# Patient Record
Sex: Female | Born: 1995 | Race: White | Hispanic: No | Marital: Single | Smoking: Never smoker
Health system: Southern US, Community
[De-identification: ages and names within clinical notes are randomized; demographics above are authoritative.]

## PROBLEM LIST (undated history)

## (undated) DIAGNOSIS — R945 Abnormal results of liver function studies: Secondary | ICD-10-CM

## (undated) HISTORY — DX: Abnormal results of liver function studies: R94.5

---
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
## 2016-04-22 ENCOUNTER — Encounter: Payer: Self-pay | Admitting: Obstetrics & Gynecology

## 2016-04-25 ENCOUNTER — Ambulatory Visit (INDEPENDENT_AMBULATORY_CARE_PROVIDER_SITE_OTHER): Payer: BLUE CROSS/BLUE SHIELD | Admitting: Nurse Practitioner

## 2016-04-25 ENCOUNTER — Encounter: Payer: Self-pay | Admitting: Nurse Practitioner

## 2016-04-25 VITALS — BP 118/72 | HR 56 | Resp 16 | Ht 63.75 in | Wt 146.0 lb

## 2016-04-25 DIAGNOSIS — Z01419 Encounter for gynecological examination (general) (routine) without abnormal findings: Secondary | ICD-10-CM

## 2016-04-25 DIAGNOSIS — Z0000 Encounter for general adult medical examination without abnormal findings: Secondary | ICD-10-CM | POA: Diagnosis not present

## 2016-04-25 DIAGNOSIS — Z113 Encounter for screening for infections with a predominantly sexual mode of transmission: Secondary | ICD-10-CM

## 2016-04-25 LAB — POCT URINALYSIS DIPSTICK
BILIRUBIN UA: NEGATIVE
Glucose, UA: NEGATIVE
KETONES UA: NEGATIVE
LEUKOCYTES UA: NEGATIVE
Nitrite, UA: NEGATIVE
Protein, UA: NEGATIVE
Urobilinogen, UA: NEGATIVE
pH, UA: 7

## 2016-04-25 LAB — HEMOGLOBIN, FINGERSTICK: Hemoglobin, fingerstick: 13.6 g/dL (ref 12.0–16.0)

## 2016-04-25 MED ORDER — NORETHIN ACE-ETH ESTRAD-FE 1-20 MG-MCG PO TABS: 1.0000 | ORAL_TABLET | Freq: Every day | ORAL | Status: DC

## 2016-04-25 NOTE — Progress Notes (Signed)
20 y.o. G0P0000 Single  Caucasian Fe here for annual exam.  Menses 5-6 days. Heavy X 2-3 days.  Super tampon changing every 4 hours.  Some cramps and relief with OTC NSAID's.  Same partner for 1 year.  Wants STD's.  No FMH of blood clots, non smoker.  Patient's last menstrual period was 04/21/2016.          Sexually active: Yes.    The current method of family planning is condoms every time.    Exercising: Yes.    run, cycle, swim Smoker:  no  Health Maintenance: Pap:  Never TDaP: Summer 2016 Gardasil: Completed 2015 HIV: will do today Labs: today Urine:  large RBC - on menses   reports that she has never smoked. She has never used smokeless tobacco. She reports that she does not drink alcohol or use illicit drugs.  History reviewed. No pertinent past medical history.  Past Surgical History  Procedure Laterality Date  . Wisdom tooth extraction  2016    Current Outpatient Prescriptions  Medication Sig Dispense Refill  . Ibuprofen (ADVIL) 200 MG CAPS Take by mouth daily as needed.    . norethindrone-ethinyl estradiol (JUNEL FE,GILDESS FE,LOESTRIN FE) 1-20 MG-MCG tablet Take 1 tablet by mouth daily. 3 Package 0   No current facility-administered medications for this visit.    Family History  Problem Relation Age of Onset  . Diabetes Mother   . Heart disease Maternal Grandfather   . Heart disease Paternal Grandfather   . Diabetes Brother     ROS:  Pertinent items are noted in HPI.  Otherwise, a comprehensive ROS was negative.  Exam:   BP 118/72 mmHg  Pulse 56  Resp 16  Ht 5' 3.75" (1.619 m)  Wt 146 lb (66.225 kg)  BMI 25.27 kg/m2  LMP 04/21/2016 Height: 5' 3.75" (161.9 cm) Ht Readings from Last 3 Encounters:  04/25/16 5' 3.75" (1.619 m) (42 %*, Z = -0.20)   * Growth percentiles are based on CDC 2-20 Years data.    General appearance: alert, cooperative and appears stated age Head: Normocephalic, without obvious abnormality, atraumatic Neck: no adenopathy, supple,  symmetrical, trachea midline and thyroid normal to inspection and palpation Lungs: clear to auscultation bilaterally Breasts: normal appearance, no masses or tenderness Heart: regular rate and rhythm Abdomen: soft, non-tender; no masses,  no organomegaly Extremities: extremities normal, atraumatic, no cyanosis or edema Skin: Skin color, texture, turgor normal. No rashes or lesions Lymph nodes: Cervical, supraclavicular, and axillary nodes normal. No abnormal inguinal nodes palpated Neurologic: Grossly normal   Pelvic: External genitalia:  no lesions              Urethra:  normal appearing urethra with no masses, tenderness or lesions              Bartholin's and Skene's: normal                 Vagina: normal appearing vagina with normal color and moderate flow with menses, no lesions              Cervix: anteverted              Pap taken: No. Bimanual Exam:  Uterus:  normal size, contour, position, consistency, mobility, non-tender              Adnexa: no mass, fullness, tenderness               Rectovaginal: Confirms  Anus:  normal sphincter tone, no lesions  Chaperone present: yes  A:  Well Woman with normal exam  Contraception needs  R/O STD's  P:   Reviewed health and wellness pertinent to exam  Pap smear as above  Reviewed all methods of BC including OCP, Nexplanon, IUD, Nuva Ring, Depo Provera  RX Loestrin Fe 1/20 for 3 months then consult to review and BP recheck.  Will follow with STD's  Counseled on breast self exam, STD prevention, HIV risk factors and prevention, use and side effects of OCP's, adequate intake of calcium and vitamin D, diet and exercise return annually or prn  An After Visit Summary was printed and given to the patient.

## 2016-04-25 NOTE — Patient Instructions (Addendum)
General topics  Next pap or exam is  due in 1 year Take a Women's multivitamin Take 1200 mg. of calcium daily - prefer dietary If any concerns in interim to call back  Breast Self-Awareness Practicing breast self-awareness may pick up problems early, prevent significant medical complications, and possibly save your life. By practicing breast self-awareness, you can become familiar with how your breasts look and feel and if your breasts are changing. This allows you to notice changes early. It can also offer you some reassurance that your breast health is good. One way to learn what is normal for your breasts and whether your breasts are changing is to do a breast self-exam. If you find a lump or something that was not present in the past, it is best to contact your caregiver right away. Other findings that should be evaluated by your caregiver include nipple discharge, especially if it is bloody; skin changes or reddening; areas where the skin seems to be pulled in (retracted); or new lumps and bumps. Breast pain is seldom associated with cancer (malignancy), but should also be evaluated by a caregiver. BREAST SELF-EXAM The best time to examine your breasts is 5 7 days after your menstrual period is over.  ExitCare Patient Information 2013 ExitCare[employer] LLC.   Exercise to Stay Healthy Exercise helps you become and stay healthy. EXERCISE IDEAS AND TIPS Choose exercises that:  You enjoy.  Fit into your day. You do not need to exercise really hard to be healthy. You can do exercises at a slow or medium level and stay healthy. You can:  Stretch before and after working out.  Try yoga, Pilates, or tai chi.  Lift weights.  Walk fast, swim, jog, run, climb stairs, bicycle, dance, or rollerskate.  Take aerobic classes. Exercises that burn about 150 calories:  Running 1  miles in 15 minutes.  Playing volleyball for 45 to 60 minutes.  Washing and waxing a car for 45 to 60  minutes.  Playing touch football for 45 minutes.  Walking 1  miles in 35 minutes.  Pushing a stroller 1  miles in 30 minutes.  Playing basketball for 30 minutes.  Raking leaves for 30 minutes.  Bicycling 5 miles in 30 minutes.  Walking 2 miles in 30 minutes.  Dancing for 30 minutes.  Shoveling snow for 15 minutes.  Swimming laps for 20 minutes.  Walking up stairs for 15 minutes.  Bicycling 4 miles in 15 minutes.  Gardening for 30 to 45 minutes.  Jumping rope for 15 minutes.  Washing windows or floors for 45 to 60 minutes. Document Released: 11/12/2010 Document Revised: 01/02/2012 Document Reviewed: 11/12/2010 ExitCare Patient Information 2013 ExitCare[employer] LLC.   Other topics ( that may be useful information):    Sexually Transmitted Disease Sexually transmitted disease (STD) refers to any infection that is passed from person to person during sexual activity. This may happen by way of saliva, semen, blood, vaginal mucus, or urine. Common STDs include:  Gonorrhea.  Chlamydia.  Syphilis.  HIV/AIDS.  Genital herpes.  Hepatitis B and C.  Trichomonas.  Human papillomavirus (HPV).  Pubic lice. CAUSES  An STD may be spread by bacteria, virus, or parasite. A person can get an STD by:  Sexual intercourse with an infected person.  Sharing sex toys with an infected person.  Sharing needles with an infected person.  Having intimate contact with the genitals, mouth, or rectal areas of an infected person. SYMPTOMS  Some people may not have any symptoms, but   they can still pass the infection to others. Different STDs have different symptoms. Symptoms include:  Painful or bloody urination.  Pain in the pelvis, abdomen, vagina, anus, throat, or eyes.  Skin rash, itching, irritation, growths, or sores (lesions). These usually occur in the genital or anal area.  Abnormal vaginal discharge.  Penile discharge in men.  Soft, flesh-colored skin growths in the  genital or anal area.  Fever.  Pain or bleeding during sexual intercourse.  Swollen glands in the groin area.  Yellow skin and eyes (jaundice). This is seen with hepatitis. DIAGNOSIS  To make a diagnosis, your caregiver may:  Take a medical history.  Perform a physical exam.  Take a specimen (culture) to be examined.  Examine a sample of discharge under a microscope.  Perform blood test TREATMENT   Chlamydia, gonorrhea, trichomonas, and syphilis can be cured with antibiotic medicine.  Genital herpes, hepatitis, and HIV can be treated, but not cured, with prescribed medicines. The medicines will lessen the symptoms.  Genital warts from HPV can be treated with medicine or by freezing, burning (electrocautery), or surgery. Warts may come back.  HPV is a virus and cannot be cured with medicine or surgery.However, abnormal areas may be followed very closely by your caregiver and may be removed from the cervix, vagina, or vulva through office procedures or surgery. If your diagnosis is confirmed, your recent sexual partners need treatment. This is true even if they are symptom-free or have a negative culture or evaluation. They should not have sex until their caregiver says it is okay. HOME CARE INSTRUCTIONS  All sexual partners should be informed, tested, and treated for all STDs.  Take your antibiotics as directed. Finish them even if you start to feel better.  Only take over-the-counter or prescription medicines for pain, discomfort, or fever as directed by your caregiver.  Rest.  Eat a balanced diet and drink enough fluids to keep your urine clear or pale yellow.  Do not have sex until treatment is completed and you have followed up with your caregiver. STDs should be checked after treatment.  Keep all follow-up appointments, Pap tests, and blood tests as directed by your caregiver.  Only use latex condoms and water-soluble lubricants during sexual activity. Do not use  petroleum jelly or oils.  Avoid alcohol and illegal drugs.  Get vaccinated for HPV and hepatitis. If you have not received these vaccines in the past, talk to your caregiver about whether one or both might be right for you.  Avoid risky sex practices that can break the skin. The only way to avoid getting an STD is to avoid all sexual activity.Latex condoms and dental dams (for oral sex) will help lessen the risk of getting an STD, but will not completely eliminate the risk. SEEK MEDICAL CARE IF:   You have a fever.  You have any new or worsening symptoms. Document Released: 12/31/2002 Document Revised: 01/02/2012 Document Reviewed: 01/07/2011 [facility] Patient Information 2013 [employer].    Domestic Abuse You are being battered or abused if someone close to you hits, pushes, or physically hurts you in any way. You also are being abused if you are forced into activities. You are being sexually abused if you are forced to have sexual contact of any kind. You are being emotionally abused if you are made to feel worthless or if you are constantly threatened. It is important to remember that help is available. No one has the right to abuse you. PREVENTION OF FURTHER  ABUSE  Learn the warning signs of danger. This varies with situations but may include: the use of alcohol, threats, isolation from friends and family, or forced sexual contact. Leave if you feel that violence is going to occur.  If you are attacked or beaten, report it to the police so the abuse is documented. You do not have to press charges. The police can protect you while you or the attackers are leaving. Get the officer's name and badge number and a copy of the report.  Find someone you can trust and tell them what is happening to you: your caregiver, a nurse, clergy member, close friend or family member. Feeling ashamed is natural, but remember that you have done nothing wrong. No one deserves abuse. Document Released:  10/07/2000 Document Revised: 01/02/2012 Document Reviewed: 12/16/2010 ExitCare Patient Information 2013 ExitCare, LLC.    How Much is Too Much Alcohol? Drinking too much alcohol can cause injury, accidents, and health problems. These types of problems can include:   Car crashes.  Falls.  Family fighting (domestic violence).  Drowning.  Fights.  Injuries.  Burns.  Damage to certain organs.  Having a baby with birth defects. ONE DRINK CAN BE TOO MUCH WHEN YOU ARE:  Working.  Pregnant or breastfeeding.  Taking medicines. Ask your doctor.  Driving or planning to drive. If you or someone you know has a drinking problem, get help from a doctor.  Document Released: 08/06/2009 Document Revised: 01/02/2012 Document Reviewed: 08/06/2009 ExitCare Patient Information 2013 ExitCare[employer].   Smoking Hazards Smoking cigarettes is extremely bad for your health. Tobacco smoke has over 200 known poisons in it. There are over 60 chemicals in tobacco smoke that cause cancer. Some of the chemicals found in cigarette smoke include:   Cyanide.  Benzene.  Formaldehyde.  Methanol (wood alcohol).  Acetylene (fuel used in welding torches).  Ammonia. Cigarette smoke also contains the poisonous gases nitrogen oxide and carbon monoxide.  Cigarette smokers have an increased risk of many serious medical problems and Smoking causes approximately:  90% of all lung cancer deaths in men.  80% of all lung cancer deaths in women.  90% of deaths from chronic obstructive lung disease. Compared with nonsmokers, smoking increases the risk of:  Coronary heart disease by 2 to 4 times.  Stroke by 2 to 4 times.  Men developing lung cancer by 23 times.  Women developing lung cancer by 13 times.  Dying from chronic obstructive lung diseases by 12 times.  . Smoking is the most preventable cause of death and disease in our society.  WHY IS SMOKING ADDICTIVE?  Nicotine is the chemical  agent in tobacco that is capable of causing addiction or dependence.  When you smoke and inhale, nicotine is absorbed rapidly into the bloodstream through your lungs. Nicotine absorbed through the lungs is capable of creating a powerful addiction. Both inhaled and non-inhaled nicotine may be addictive.  Addiction studies of cigarettes and spit tobacco show that addiction to nicotine occurs mainly during the teen years, when young people begin using tobacco products. WHAT ARE THE BENEFITS OF QUITTING?  There are many health benefits to quitting smoking.   Likelihood of developing cancer and heart disease decreases. Health improvements are seen almost immediately.  Blood pressure, pulse rate, and breathing patterns start returning to normal soon after quitting. QUITTING SMOKING   American Lung Association - 1-800-LUNGUSA  American Cancer Society - 1-800-ACS-2345 Document Released: 11/17/2004 Document Revised: 01/02/2012 Document Reviewed: 07/22/2009 ExitCare Patient Information 2013 ExitCare,   LLC.   Stress Management Stress is a state of physical or mental tension that often results from changes in your life or normal routine. Some common causes of stress are:  Death of a loved one.  Injuries or severe illnesses.  Getting fired or changing jobs.  Moving into a new home. Other causes may be:  Sexual problems.  Business or financial losses.  Taking on a large debt.  Regular conflict with someone at home or at work.  Constant tiredness from lack of sleep. It is not just bad things that are stressful. It may be stressful to:  Win the lottery.  Get married.  Buy a new car. The amount of stress that can be easily tolerated varies from person to person. Changes generally cause stress, regardless of the types of change. Too much stress can affect your health. It may lead to physical or emotional problems. Too little stress (boredom) may also become stressful. SUGGESTIONS TO  REDUCE STRESS:  Talk things over with your family and friends. It often is helpful to share your concerns and worries. If you feel your problem is serious, you may want to get help from a professional counselor.  Consider your problems one at a time instead of lumping them all together. Trying to take care of everything at once may seem impossible. List all the things you need to do and then start with the most important one. Set a goal to accomplish 2 or 3 things each day. If you expect to do too many in a single day you will naturally fail, causing you to feel even more stressed.  Do not use alcohol or drugs to relieve stress. Although you may feel better for a short time, they do not remove the problems that caused the stress. They can also be habit forming.  Exercise regularly - at least 3 times per week. Physical exercise can help to relieve that "uptight" feeling and will relax you.  The shortest distance between despair and hope is often a good night's sleep.  Go to bed and get up on time allowing yourself time for appointments without being rushed.  Take a short "time-out" period from any stressful situation that occurs during the day. Close your eyes and take some deep breaths. Starting with the muscles in your face, tense them, hold it for a few seconds, then relax. Repeat this with the muscles in your neck, shoulders, hand, stomach, back and legs.  Take good care of yourself. Eat a balanced diet and get plenty of rest.  Schedule time for having fun. Take a break from your daily routine to relax. HOME CARE INSTRUCTIONS   Call if you feel overwhelmed by your problems and feel you can no longer manage them on your own.  Return immediately if you feel like hurting yourself or someone else. Document Released: 04/05/2001 Document Revised: 01/02/2012 Document Reviewed: 11/26/2007 ExitCare Patient Information 2013 ExitCare, LLC.  Oral Contraception Information Oral contraceptive  pills (OCPs) are medicines taken to prevent pregnancy. OCPs work by preventing the ovaries from releasing eggs. The hormones in OCPs also cause the cervical mucus to thicken, preventing the sperm from entering the uterus. The hormones also cause the uterine lining to become thin, not allowing a fertilized egg to attach to the inside of the uterus. OCPs are highly effective when taken exactly as prescribed. However, OCPs do not prevent sexually transmitted diseases (STDs). Safe sex practices, such as using condoms along with the pill, can help prevent STDs.    Before taking the pill, you may have a physical exam and Pap test. Your health care provider may order blood tests. The health care provider will make sure you are a good candidate for oral contraception. Discuss with your health care provider the possible side effects of the OCP you may be prescribed. When starting an OCP, it can take 2 to 3 months for the body to adjust to the changes in hormone levels in your body.  TYPES OF ORAL CONTRACEPTION  The combination pill--This pill contains estrogen and progestin (synthetic progesterone) hormones. The combination pill comes in 21-day, 28-day, or 91-day packs. Some types of combination pills are meant to be taken continuously (365-day pills). With 21-day packs, you do not take pills for 7 days after the last pill. With 28-day packs, the pill is taken every day. The last 7 pills are without hormones. Certain types of pills have more than 21 hormone-containing pills. With 91-day packs, the first 84 pills contain both hormones, and the last 7 pills contain no hormones or contain estrogen only.  The minipill--This pill contains the progesterone hormone only. The pill is taken every day continuously. It is very important to take the pill at the same time each day. The minipill comes in packs of 28 pills. All 28 pills contain the hormone.  ADVANTAGES OF ORAL CONTRACEPTIVE PILLS  Decreases premenstrual symptoms.    Treats menstrual period cramps.   Regulates the menstrual cycle.   Decreases a heavy menstrual flow.   May treatacne, depending on the type of pill.   Treats abnormal uterine bleeding.   Treats polycystic ovarian syndrome.   Treats endometriosis.   Can be used as emergency contraception.  THINGS THAT CAN MAKE ORAL CONTRACEPTIVE PILLS LESS EFFECTIVE OCPs can be less effective if:   You forget to take the pill at the same time every day.   You have a stomach or intestinal disease that lessens the absorption of the pill.   You take OCPs with other medicines that make OCPs less effective, such as antibiotics, certain HIV medicines, and some seizure medicines.   You take expired OCPs.   You forget to restart the pill on day 7, when using the packs of 21 pills.  RISKS ASSOCIATED WITH ORAL CONTRACEPTIVE PILLS  Oral contraceptive pills can sometimes cause side effects, such as:  Headache.  Nausea.  Breast tenderness.  Irregular bleeding or spotting. Combination pills are also associated with a small increased risk of:  Blood clots.  Heart attack.  Stroke.   This information is not intended to replace advice given to you by your health care provider. Make sure you discuss any questions you have with your health care provider.   Document Released: 12/31/2002 Document Revised: 07/31/2013 Document Reviewed: 03/31/2013 Elsevier Interactive Patient Education 2016 Elsevier Inc.  

## 2016-04-26 LAB — STD PANEL
HIV 1&2 Ab, 4th Generation: NONREACTIVE
Hepatitis B Surface Ag: NEGATIVE

## 2016-04-26 NOTE — Progress Notes (Signed)
Encounter reviewed by Dr. Parminder Cupples Amundson C. Silva.  

## 2016-04-28 LAB — IPS N GONORRHOEA AND CHLAMYDIA BY PCR

## 2016-05-12 ENCOUNTER — Other Ambulatory Visit: Payer: Self-pay | Admitting: *Deleted

## 2016-05-12 NOTE — Telephone Encounter (Signed)
Patient called. States she is supposed to have an OCP follow up in September but she is going to be in school. Should she come earlier or after during thanksgiving holiday?   [facility]  Medication refill request: OCP Last AEX:  04/25/16 PG Next AEX: None Last MMG (if hormonal medication request): None Refill authorized: 04/25/16 #[id number]. Today please advise.

## 2016-05-13 NOTE — Telephone Encounter (Signed)
Will not refill she has 3 already

## 2016-05-13 NOTE — Telephone Encounter (Signed)
She needs appointment right before school starts. Please schedule

## 2016-05-16 ENCOUNTER — Telehealth: Payer: Self-pay | Admitting: Certified Nurse Midwife

## 2016-05-16 MED ORDER — NORETHIN ACE-ETH ESTRAD-FE 1-20 MG-MCG PO TABS: 1.0000 | ORAL_TABLET | Freq: Every day | ORAL | 0 refills | Status: DC

## 2016-05-16 NOTE — Telephone Encounter (Signed)
Called patient. Rescheduled appt for 10/06. Per Mrs Alexia Freestone.

## 2016-05-16 NOTE — Addendum Note (Signed)
Addended by: Loreta Ave on: 05/16/2016 12:42 PM   Modules accepted: Orders

## 2016-05-16 NOTE — Telephone Encounter (Signed)
Patient scheduled her follow up appointment 05/31/16 with Leota Sauers, CNM.

## 2016-05-16 NOTE — Telephone Encounter (Signed)
Needs refill sent to pharmacy to last until 08/2016

## 2016-05-16 NOTE — Telephone Encounter (Signed)
Left voicemail to call back.  Mrs Heidi Ruiz states she can be seen during the holidays. She needs to schedule OV

## 2016-05-31 ENCOUNTER — Ambulatory Visit: Payer: BLUE CROSS/BLUE SHIELD | Admitting: Certified Nurse Midwife

## 2016-07-11 ENCOUNTER — Other Ambulatory Visit: Payer: Self-pay | Admitting: Nurse Practitioner

## 2016-07-11 NOTE — Telephone Encounter (Signed)
Medication refill request: OCP Last AEX:  04-25-16 Next AEX: not scheduled  Next OV: 07-29-16 Last MMG (if hormonal medication request): N/A Refill authorized: please advise

## 2016-07-11 NOTE — Telephone Encounter (Signed)
She has an apt on 10/6 for recheck - ok to refill X 1 time.

## 2016-07-29 ENCOUNTER — Encounter: Payer: Self-pay | Admitting: Nurse Practitioner

## 2016-07-29 ENCOUNTER — Ambulatory Visit (INDEPENDENT_AMBULATORY_CARE_PROVIDER_SITE_OTHER): Payer: BLUE CROSS/BLUE SHIELD | Admitting: Nurse Practitioner

## 2016-07-29 VITALS — BP 122/84 | HR 64 | Ht 63.75 in | Wt 140.0 lb

## 2016-07-29 DIAGNOSIS — Z8669 Personal history of other diseases of the nervous system and sense organs: Secondary | ICD-10-CM

## 2016-07-29 DIAGNOSIS — Z3041 Encounter for surveillance of contraceptive pills: Secondary | ICD-10-CM | POA: Diagnosis not present

## 2016-07-29 MED ORDER — NORETHINDRONE ACET-ETHINYL EST 1.5-30 MG-MCG PO TABS: 1.0000 | ORAL_TABLET | Freq: Every day | ORAL | 3 refills | Status: DC

## 2016-07-29 NOTE — Progress Notes (Signed)
20 y.o. Single Caucasian female G0P0000 here for consult visit to follow up of OCP.  She has a history of dysmenorrhea and menorrhagia.  She also needed something for birth control.  She started on Loestrin Fe 1/20 in July.  This is her 3 month recheck visit.  First pack of OCP with BTB at midcycle.  Menses was a little lighter and shorter.  Second month with some BTB at mid cycle again but cycle was shorter and lighter.  Total cycle now at 4 days.  At 3rd pack still BTB that was heavier and longer X 4 day.  Now on 4 th pack.  There has not been any compliance issues. I have discussed with the great importance of following the treatment plan exactly as directed in order to achieve a good medical outcome.   She is having less PMS.  First 2 pack some nausea at first part of pills but gets better after a few days - none 3 & 4 th pack.  Same partner but he is away for studies abroad.  She also has a sore place - bump in her left ear that is bothersome.  It did have some discharge initially.  Has been cleaning wit hydrogen peroxide.  Wants to get this checked today.  Denies any other medical problems or pelvic symptoms.    O: Healthy WD,WN female Affect: normal Skin: left outer ear canal with no exudate but there is swelling from trying to get the bump out.  No induration, not warm to touch, no lymphadenopathy   A: Follow up on OCP  Sebaceous cyst left ear canal    P:  Discussed that we will increase her OCP to stop the BTB at mid cycle - if any problems or continued BTB to call back.  GC & Chl was negative 04/25/16.  Cyst left ear canal   Labs:  none   Instructions given regarding:  Use warm compresses prn - use OTC antibiotic ointment BID - if not better by Monday to call.  She may need a short course of Keflex.  RV

## 2016-07-29 NOTE — Patient Instructions (Signed)

## 2016-08-02 NOTE — Progress Notes (Signed)
Encounter reviewed by Dr. Brook Amundson C. Silva.  

## 2016-08-12 ENCOUNTER — Other Ambulatory Visit: Payer: Self-pay | Admitting: Nurse Practitioner

## 2016-08-12 NOTE — Telephone Encounter (Signed)
Medication refill request: MICROGESTIN FE Last AEX:  04/25/16 PG Next AEX: none scheduled Last MMG (if hormonal medication request): n/a Refill authorized: 07/29/16 #3packs w/3 refills; today refuse; already refilled

## 2016-10-05 DIAGNOSIS — J029 Acute pharyngitis, unspecified: Secondary | ICD-10-CM | POA: Diagnosis not present

## 2016-10-05 DIAGNOSIS — B2790 Infectious mononucleosis, unspecified without complication: Secondary | ICD-10-CM | POA: Diagnosis not present

## 2016-11-20 DIAGNOSIS — Z9289 Personal history of other medical treatment: Secondary | ICD-10-CM | POA: Diagnosis not present

## 2016-11-20 DIAGNOSIS — J00 Acute nasopharyngitis [common cold]: Secondary | ICD-10-CM | POA: Diagnosis not present

## 2016-11-20 DIAGNOSIS — J029 Acute pharyngitis, unspecified: Secondary | ICD-10-CM | POA: Diagnosis not present

## 2016-12-16 DIAGNOSIS — R300 Dysuria: Secondary | ICD-10-CM | POA: Diagnosis not present

## 2016-12-24 DIAGNOSIS — N3001 Acute cystitis with hematuria: Secondary | ICD-10-CM | POA: Diagnosis not present

## 2017-02-02 DIAGNOSIS — Z202 Contact with and (suspected) exposure to infections with a predominantly sexual mode of transmission: Secondary | ICD-10-CM | POA: Diagnosis not present

## 2017-02-21 DIAGNOSIS — Z113 Encounter for screening for infections with a predominantly sexual mode of transmission: Secondary | ICD-10-CM | POA: Diagnosis not present

## 2017-02-21 DIAGNOSIS — Z114 Encounter for screening for human immunodeficiency virus [HIV]: Secondary | ICD-10-CM | POA: Diagnosis not present

## 2017-04-21 ENCOUNTER — Telehealth: Payer: Self-pay | Admitting: Nurse Practitioner

## 2017-04-21 NOTE — Telephone Encounter (Signed)
Left message regarding upcoming appointment has been canceled and needs to be rescheduled. °

## 2017-05-02 ENCOUNTER — Ambulatory Visit: Payer: BLUE CROSS/BLUE SHIELD | Admitting: Nurse Practitioner

## 2017-05-02 NOTE — Progress Notes (Signed)
Patient ID: Heidi Ruiz, female   DOB: 1995/11/04, 21 y.o.   MRN: [id number]  21 y.o. [id number] SingleCaucasianF here for annual exam.   On OCP's. Cycles q month x 5 days, saturates a super + tampon in about 6 hours. No BTB. Cramps are tolerable, helped with Advil. Sexually active, same partner x 1.5 years. Using condoms. No pain.     Patient's last menstrual period was 04/17/2017.          Sexually active: Yes.    The current method of family planning is OCP (estrogen/progesterone).    Exercising: Yes.    running, swimming Smoker:  no  Health Maintenance: Pap: None History of abnormal Pap:  no TDaP:  04/2015 Gardasil: Completed 2015   reports that she has never smoked. She has never used smokeless tobacco. She reports that she does not drink alcohol or use drugs. Occasional ETOH. She goes to [employer], [employer].   History reviewed. No pertinent past medical history.  Past Surgical History:  Procedure Laterality Date  . WISDOM TOOTH EXTRACTION  2016    Current Outpatient Prescriptions  Medication Sig Dispense Refill  . Ibuprofen (ADVIL) 200 MG CAPS Take by mouth daily as needed.    . Norethindrone Acetate-Ethinyl Estradiol (LOESTRIN 1.5/30, 21,) 1.5-30 MG-MCG tablet Take 1 tablet by mouth daily. 3 Package 3   No current facility-administered medications for this visit.     Family History  Problem Relation Age of Onset  . Diabetes Mother   . Heart disease Maternal Grandfather   . Heart disease Paternal Grandfather   . Diabetes Brother     Review of Systems  Constitutional: Negative.   HENT: Negative.   Eyes: Negative.   Respiratory: Negative.   Cardiovascular: Negative.   Gastrointestinal: Negative.   Endocrine: Negative.   Genitourinary: Negative.   Musculoskeletal: Negative.   Skin: Negative.   Allergic/Immunologic: Negative.   Neurological: Negative.   Hematological: Negative.   Psychiatric/Behavioral: Negative.     Exam:   BP 120/82 (BP  Location: Right Arm, Patient Position: Sitting, Cuff Size: Normal)   Pulse 66   Resp 14   Ht 5\' 4"  (1.626 m)   Wt 142 lb (64.4 kg)   LMP 04/17/2017   BMI 24.37 kg/m   Weight change: @WEIGHTCHANGE @ Height:   Height: 5\' 4"  (162.6 cm)  Ht Readings from Last 3 Encounters:  05/05/17 5\' 4"  (1.626 m) (45 %, Z= -0.12)*  07/29/16 5' 3.75" (1.619 m) (42 %, Z= -0.21)*  04/25/16 5' 3.75" (1.619 m) (42 %, Z= -0.20)*   * Growth percentiles are based on CDC 2-20 Years data.    General appearance: alert, cooperative and appears stated age Head: Normocephalic, without obvious abnormality, atraumatic Neck: no adenopathy, supple, symmetrical, trachea midline and thyroid thyroid asymmetric R>L concerning for possible nodule Lungs: clear to auscultation bilaterally Cardiovascular: regular rate and rhythm Breasts: normal appearance, no masses or tenderness Abdomen: soft, non-tender; bowel sounds normal; no masses,  no organomegaly Extremities: extremities normal, atraumatic, no cyanosis or edema Skin: Skin color, texture, turgor normal. No rashes or lesions Lymph nodes: Cervical, supraclavicular, and axillary nodes normal. No abnormal inguinal nodes palpated Neurologic: Grossly normal Pelvic: deferred   A:  Well Woman exam  Right lobe of thyroid enlarged compared to the left  Screening STD  Family history of diabetes  Contraception, doing well on OCP's  Dysmenorrhea, mild, tolerable with advil    P:   No pap until she is 22  STD testing  Screening labs  Continue OCP's  Condoms encouraged  Thyroid ultrasound, TFT's  Discussed calcium and vit D intake  Information on breast self awareness given

## 2017-05-05 ENCOUNTER — Ambulatory Visit (INDEPENDENT_AMBULATORY_CARE_PROVIDER_SITE_OTHER): Payer: BLUE CROSS/BLUE SHIELD | Admitting: Obstetrics and Gynecology

## 2017-05-05 ENCOUNTER — Encounter: Payer: Self-pay | Admitting: Obstetrics and Gynecology

## 2017-05-05 VITALS — BP 120/82 | HR 66 | Resp 14 | Ht 64.0 in | Wt 142.0 lb

## 2017-05-05 DIAGNOSIS — Z833 Family history of diabetes mellitus: Secondary | ICD-10-CM

## 2017-05-05 DIAGNOSIS — Z01411 Encounter for gynecological examination (general) (routine) with abnormal findings: Secondary | ICD-10-CM | POA: Diagnosis not present

## 2017-05-05 DIAGNOSIS — E049 Nontoxic goiter, unspecified: Secondary | ICD-10-CM | POA: Diagnosis not present

## 2017-05-05 DIAGNOSIS — Z113 Encounter for screening for infections with a predominantly sexual mode of transmission: Secondary | ICD-10-CM | POA: Diagnosis not present

## 2017-05-05 DIAGNOSIS — Z3041 Encounter for surveillance of contraceptive pills: Secondary | ICD-10-CM

## 2017-05-05 DIAGNOSIS — Z0000 Encounter for general adult medical examination without abnormal findings: Secondary | ICD-10-CM

## 2017-05-05 MED ORDER — NORETHINDRONE ACET-ETHINYL EST 1.5-30 MG-MCG PO TABS: 1.0000 | ORAL_TABLET | Freq: Every day | ORAL | 3 refills | Status: DC

## 2017-05-05 NOTE — Patient Instructions (Signed)
EXERCISE AND DIET:  We recommended that you start or continue a regular exercise program for good health. Regular exercise means any activity that makes your heart beat faster and makes you sweat.  We recommend exercising at least 30 minutes per day at least 3 days a week, preferably 4 or 5.  We also recommend a diet low in fat and sugar.  Inactivity, poor dietary choices and obesity can cause diabetes, heart attack, stroke, and kidney damage, among others.    ALCOHOL AND SMOKING:  Women should limit their alcohol intake to no more than 7 drinks/beers/glasses of wine (combined, not each!) per week. Moderation of alcohol intake to this level decreases your risk of breast cancer and liver damage. And of course, no recreational drugs are part of a healthy lifestyle.  And absolutely no smoking or even second hand smoke. Most people know smoking can cause heart and lung diseases, but did you know it also contributes to weakening of your bones? Aging of your skin?  Yellowing of your teeth and nails?  CALCIUM AND VITAMIN D:  Adequate intake of calcium and Vitamin D are recommended.  The recommendations for exact amounts of these supplements seem to change often, but generally speaking 600 mg of calcium (either carbonate or citrate) and 800 units of Vitamin D per day seems prudent. Certain women may benefit from higher intake of Vitamin D.  If you are among these women, your doctor will have told you during your visit.    PAP SMEARS:  Pap smears, to check for cervical cancer or precancers,  have traditionally been done yearly, although recent scientific advances have shown that most women can have pap smears less often.  However, every woman still should have a physical exam from her gynecologist every year. It will include a breast check, inspection of the vulva and vagina to check for abnormal growths or skin changes, a visual exam of the cervix, and then an exam to evaluate the size and shape of the uterus and  ovaries.  And after 20 years of age, a rectal exam is indicated to check for rectal cancers. We will also provide age appropriate advice regarding health maintenance, like when you should have certain vaccines, screening for sexually transmitted diseases, bone density testing, colonoscopy, mammograms, etc.    Breast Self-Awareness Breast self-awareness means being familiar with how your breasts look and feel. It involves checking your breasts regularly and reporting any changes to your health care provider. Practicing breast self-awareness is important. A change in your breasts can be a sign of a serious medical problem. Being familiar with how your breasts look and feel allows you to find any problems early, when treatment is more likely to be successful. All women should practice breast self-awareness, including women who have had breast implants. How to do a breast self-exam One way to learn what is normal for your breasts and whether your breasts are changing is to do a breast self-exam. To do a breast self-exam: Look for Changes  1. Remove all the clothing above your waist. 2. Stand in front of a mirror in a room with good lighting. 3. Put your hands on your hips. 4. Push your hands firmly downward. 5. Compare your breasts in the mirror. Look for differences between them (asymmetry), such as: ? Differences in shape. ? Differences in size. ? Puckers, dips, and bumps in one breast and not the other. 6. Look at each breast for changes in your skin, such as: ? Redness. ?   Scaly areas. 7. Look for changes in your nipples, such as: ? Discharge. ? Bleeding. ? Dimpling. ? Redness. ? A change in position. Feel for Changes  Carefully feel your breasts for lumps and changes. It is best to do this while lying on your back on the floor and again while sitting or standing in the shower or tub with soapy water on your skin. Feel each breast in the following way:  Place the arm on the side of the  breast you are examining above your head.  Feel your breast with the other hand.  Start in the nipple area and make  inch (2 cm) overlapping circles to feel your breast. Use the pads of your three middle fingers to do this. Apply light pressure, then medium pressure, then firm pressure. The light pressure will allow you to feel the tissue closest to the skin. The medium pressure will allow you to feel the tissue that is a little deeper. The firm pressure will allow you to feel the tissue close to the ribs.  Continue the overlapping circles, moving downward over the breast until you feel your ribs below your breast.  Move one finger-width toward the center of the body. Continue to use the  inch (2 cm) overlapping circles to feel your breast as you move slowly up toward your collarbone.  Continue the up and down exam using all three pressures until you reach your armpit.  Write Down What You Find  Write down what is normal for each breast and any changes that you find. Keep a written record with breast changes or normal findings for each breast. By writing this information down, you do not need to depend only on memory for size, tenderness, or location. Write down where you are in your menstrual cycle, if you are still menstruating. If you are having trouble noticing differences in your breasts, do not get discouraged. With time you will become more familiar with the variations in your breasts and more comfortable with the exam. How often should I examine my breasts? Examine your breasts every month. If you are breastfeeding, the best time to examine your breasts is after a feeding or after using a breast pump. If you menstruate, the best time to examine your breasts is 5-7 days after your period is over. During your period, your breasts are lumpier, and it may be more difficult to notice changes. When should I see my health care provider? See your health care provider if you notice:  A change  in shape or size of your breasts or nipples.  A change in the skin of your breast or nipples, such as a reddened or scaly area.  Unusual discharge from your nipples.  A lump or thick area that was not there before.  Pain in your breasts.  Anything that concerns you.  This information is not intended to replace advice given to you by your health care provider. Make sure you discuss any questions you have with your health care provider. Document Released: 10/10/2005 Document Revised: 03/17/2016 Document Reviewed: 08/30/2015 Elsevier Interactive Patient Education  2018 Elsevier Inc.   

## 2017-05-06 LAB — CBC
HEMATOCRIT: 40.3 % (ref 34.0–46.6)
HEMOGLOBIN: 13.2 g/dL (ref 11.1–15.9)
MCH: 30.2 pg (ref 26.6–33.0)
MCHC: 32.8 g/dL (ref 31.5–35.7)
MCV: 92 fL (ref 79–97)
Platelets: 252 10*3/uL (ref 150–379)
RBC: 4.37 x10E6/uL (ref 3.77–5.28)
RDW: 12.7 % (ref 12.3–15.4)
WBC: 4.5 10*3/uL (ref 3.4–10.8)

## 2017-05-06 LAB — HEMOGLOBIN A1C
ESTIMATED AVERAGE GLUCOSE: 100 mg/dL
HEMOGLOBIN A1C: 5.1 % (ref 4.8–5.6)

## 2017-05-06 LAB — COMPREHENSIVE METABOLIC PANEL
ALBUMIN: 4.3 g/dL (ref 3.5–5.5)
ALT: 17 IU/L (ref 0–32)
AST: 17 IU/L (ref 0–40)
Albumin/Globulin Ratio: 1.5 (ref 1.2–2.2)
Alkaline Phosphatase: 51 IU/L (ref 39–117)
BUN / CREAT RATIO: 18 (ref 9–23)
BUN: 12 mg/dL (ref 6–20)
Bilirubin Total: 0.4 mg/dL (ref 0.0–1.2)
CALCIUM: 9.4 mg/dL (ref 8.7–10.2)
CO2: 25 mmol/L (ref 20–29)
CREATININE: 0.68 mg/dL (ref 0.57–1.00)
Chloride: 102 mmol/L (ref 96–106)
GFR calc Af Amer: 147 mL/min/{1.73_m2} (ref >59)
GFR, EST NON AFRICAN AMERICAN: 127 mL/min/{1.73_m2} (ref >59)
GLOBULIN, TOTAL: 2.8 g/dL (ref 1.5–4.5)
GLUCOSE: 80 mg/dL (ref 65–99)
Potassium: 4.1 mmol/L (ref 3.5–5.2)
Sodium: 139 mmol/L (ref 134–144)
Total Protein: 7.1 g/dL (ref 6.0–8.5)

## 2017-05-06 LAB — LIPID PANEL
Chol/HDL Ratio: 2.9 ratio (ref 0.0–4.4)
Cholesterol, Total: 138 mg/dL (ref 100–169)
HDL: 47 mg/dL (ref >39)
LDL Calculated: 75 mg/dL (ref 0–109)
TRIGLYCERIDES: 80 mg/dL (ref 0–89)
VLDL CHOLESTEROL CAL: 16 mg/dL (ref 5–40)

## 2017-05-06 LAB — HEP, RPR, HIV PANEL
HIV SCREEN 4TH GENERATION: NONREACTIVE
Hepatitis B Surface Ag: NEGATIVE
RPR: NONREACTIVE

## 2017-05-06 LAB — THYROID PANEL WITH TSH
FREE THYROXINE INDEX: 2.2 (ref 1.2–4.9)
T3 UPTAKE RATIO: 22 % — AB (ref 24–39)
T4, Total: 10 ug/dL (ref 4.5–12.0)
TSH: 1.76 u[IU]/mL (ref 0.450–4.500)

## 2017-05-06 LAB — HEPATITIS C ANTIBODY: Hep C Virus Ab: <0.1 s/co ratio (ref 0.0–0.9)

## 2017-05-09 LAB — GC/CHLAMYDIA PROBE AMP
Chlamydia trachomatis, NAA: NEGATIVE
Neisseria gonorrhoeae by PCR: NEGATIVE

## 2017-05-16 ENCOUNTER — Ambulatory Visit
Admission: RE | Admit: 2017-05-16 | Discharge: 2017-05-16 | Disposition: A | Discharge status: Home / Self Care | Payer: BLUE CROSS/BLUE SHIELD | Source: Ambulatory Visit | Attending: Obstetrics and Gynecology | Admitting: Obstetrics and Gynecology

## 2017-05-16 DIAGNOSIS — E049 Nontoxic goiter, unspecified: Secondary | ICD-10-CM

## 2017-05-18 ENCOUNTER — Telehealth: Payer: Self-pay

## 2017-05-18 NOTE — Telephone Encounter (Signed)
Spoke with patient. Advised of message as seen below from Dr.Jertson. Patient verbalizes understanding. Patient is driving to work and will return call to schedule 3 month follow up.

## 2017-05-18 NOTE — Telephone Encounter (Signed)
-----   Message from Romualdo BolkJill Evelyn Jertson, MD sent at 05/17/2017  7:37 PM EDT ----- Please inform the patient that her thyroid is mildly enlarged, but no distinct nodules. Exam suggestive of underlying thyroiditis. One of her TFT's was slightly off. Please have her return in 3 months for a thyroid panel.

## 2017-05-24 ENCOUNTER — Telehealth: Payer: Self-pay

## 2017-05-24 NOTE — Telephone Encounter (Signed)
Patient has made 3 month lab appointment 08-21-17 to recheck thyroid.

## 2017-05-24 NOTE — Telephone Encounter (Signed)
Erroneous encounter

## 2017-05-24 NOTE — Progress Notes (Signed)
Patient has lab appointment 08-21-17.

## 2017-05-24 NOTE — Telephone Encounter (Signed)
Patient has f/u lab appointment 08-21-17.

## 2017-08-21 ENCOUNTER — Other Ambulatory Visit (INDEPENDENT_AMBULATORY_CARE_PROVIDER_SITE_OTHER): Payer: BLUE CROSS/BLUE SHIELD

## 2017-08-21 ENCOUNTER — Other Ambulatory Visit: Payer: Self-pay

## 2017-08-21 DIAGNOSIS — R7989 Other specified abnormal findings of blood chemistry: Secondary | ICD-10-CM

## 2017-08-22 LAB — THYROID PANEL WITH TSH
FREE THYROXINE INDEX: 3.3 (ref 1.2–4.9)
T3 UPTAKE RATIO: 29 % (ref 24–39)
T4, Total: 11.4 ug/dL (ref 4.5–12.0)
TSH: 2.71 u[IU]/mL (ref 0.450–4.500)

## 2017-10-24 DIAGNOSIS — R7989 Other specified abnormal findings of blood chemistry: Secondary | ICD-10-CM

## 2017-10-24 HISTORY — DX: Other specified abnormal findings of blood chemistry: R79.89

## 2018-05-10 ENCOUNTER — Ambulatory Visit: Payer: BLUE CROSS/BLUE SHIELD | Admitting: Obstetrics and Gynecology

## 2018-06-11 ENCOUNTER — Other Ambulatory Visit (HOSPITAL_COMMUNITY)
Admission: RE | Admit: 2018-06-11 | Discharge: 2018-06-11 | Disposition: A | Discharge status: Home / Self Care | Payer: BLUE CROSS/BLUE SHIELD | Source: Ambulatory Visit | Attending: Obstetrics and Gynecology | Admitting: Obstetrics and Gynecology

## 2018-06-11 ENCOUNTER — Other Ambulatory Visit: Payer: Self-pay

## 2018-06-11 ENCOUNTER — Ambulatory Visit (INDEPENDENT_AMBULATORY_CARE_PROVIDER_SITE_OTHER): Payer: BLUE CROSS/BLUE SHIELD | Admitting: Obstetrics and Gynecology

## 2018-06-11 ENCOUNTER — Encounter: Payer: Self-pay | Admitting: Obstetrics and Gynecology

## 2018-06-11 VITALS — BP 98/62 | HR 64 | Resp 14 | Ht 64.0 in | Wt 138.0 lb

## 2018-06-11 DIAGNOSIS — R748 Abnormal levels of other serum enzymes: Secondary | ICD-10-CM | POA: Diagnosis not present

## 2018-06-11 DIAGNOSIS — Z113 Encounter for screening for infections with a predominantly sexual mode of transmission: Secondary | ICD-10-CM | POA: Insufficient documentation

## 2018-06-11 DIAGNOSIS — Z01419 Encounter for gynecological examination (general) (routine) without abnormal findings: Secondary | ICD-10-CM

## 2018-06-11 MED ORDER — NORETHINDRONE ACET-ETHINYL EST 1.5-30 MG-MCG PO TABS: 1.0000 | ORAL_TABLET | Freq: Every day | ORAL | 3 refills | Status: DC

## 2018-06-11 NOTE — Patient Instructions (Signed)
EXERCISE AND DIET:  We recommended that you start or continue a regular exercise program for good health. Regular exercise means any activity that makes your heart beat faster and makes you sweat.  We recommend exercising at least 30 minutes per day at least 3 days a week, preferably 4 or 5.  We also recommend a diet low in fat and sugar.  Inactivity, poor dietary choices and obesity can cause diabetes, heart attack, stroke, and kidney damage, among others.    ALCOHOL AND SMOKING:  Women should limit their alcohol intake to no more than 7 drinks/beers/glasses of wine (combined, not each!) per week. Moderation of alcohol intake to this level decreases your risk of breast cancer and liver damage. And of course, no recreational drugs are part of a healthy lifestyle.  And absolutely no smoking or even second hand smoke. Most people know smoking can cause heart and lung diseases, but did you know it also contributes to weakening of your bones? Aging of your skin?  Yellowing of your teeth and nails?  CALCIUM AND VITAMIN D:  Adequate intake of calcium and Vitamin D are recommended.  The recommendations for exact amounts of these supplements seem to change often, but generally speaking 600 mg of calcium (either carbonate or citrate) and 800 units of Vitamin D per day seems prudent. Certain women may benefit from higher intake of Vitamin D.  If you are among these women, your doctor will have told you during your visit.    PAP SMEARS:  Pap smears, to check for cervical cancer or precancers,  have traditionally been done yearly, although recent scientific advances have shown that most women can have pap smears less often.  However, every woman still should have a physical exam from her gynecologist every year. It will include a breast check, inspection of the vulva and vagina to check for abnormal growths or skin changes, a visual exam of the cervix, and then an exam to evaluate the size and shape of the uterus and  ovaries.  And after 21 years of age, a rectal exam is indicated to check for rectal cancers. We will also provide age appropriate advice regarding health maintenance, like when you should have certain vaccines, screening for sexually transmitted diseases, bone density testing, colonoscopy, mammograms, etc.   MAMMOGRAMS:  All women over 40 years old should have a yearly mammogram. Many facilities now offer a "3D" mammogram, which may cost around $50 extra out of pocket. If possible,  we recommend you accept the option to have the 3D mammogram performed.  It both reduces the number of women who will be called back for extra views which then turn out to be normal, and it is better than the routine mammogram at detecting truly abnormal areas.    COLONOSCOPY:  Colonoscopy to screen for colon cancer is recommended for all women at age 50.  We know, you hate the idea of the prep.  We agree, BUT, having colon cancer and not knowing it is worse!!  Colon cancer so often starts as a polyp that can be seen and removed at colonscopy, which can quite literally save your life!  And if your first colonoscopy is normal and you have no family history of colon cancer, most women don't have to have it again for 10 years.  Once every ten years, you can do something that may end up saving your life, right?  We will be happy to help you get it scheduled when you are ready.    Be sure to check your insurance coverage so you understand how much it will cost.  It may be covered as a preventative service at no cost, but you should check your particular policy.     Ethinyl Estradiol; Etonogestrel vaginal ring What is this medicine? ETHINYL ESTRADIOL; ETONOGESTREL (ETH in il es tra DYE ole; et oh noe JES trel) vaginal ring is a flexible, vaginal ring used as a contraceptive (birth control method). This medicine combines two types of female hormones, an estrogen and a progestin. This ring is used to prevent ovulation and pregnancy. Each  ring is effective for one month. This medicine may be used for other purposes; ask your health care provider or pharmacist if you have questions. COMMON BRAND NAME(S): NuvaRing What should I tell my health care provider before I take this medicine? They need to know if you have or ever had any of these conditions: -abnormal vaginal bleeding -blood vessel disease or blood clots -breast, cervical, endometrial, ovarian, liver, or uterine cancer -diabetes -gallbladder disease -heart disease or recent heart attack -high blood pressure -high cholesterol -kidney disease -liver disease -migraine headaches -stroke -systemic lupus erythematosus (SLE) -tobacco smoker -an unusual or allergic reaction to estrogens, progestins, other medicines, foods, dyes, or preservatives -pregnant or trying to get pregnant -breast-feeding How should I use this medicine? Insert the ring into your vagina as directed. Follow the directions on the prescription label. The ring will remain place for 3 weeks and is then removed for a 1-week break. A new ring is inserted 1 week after the last ring was removed, on the same day of the week. Check often to make sure the ring is still in place, especially before and after sexual intercourse. If the ring was out of the vagina for an unknown amount of time, you may not be protected from pregnancy. Perform a pregnancy test and call your doctor. Do not use more often than directed. A patient package insert for the product will be given with each prescription and refill. Read this sheet carefully each time. The sheet may change frequently. Contact your pediatrician regarding the use of this medicine in children. Special care may be needed. This medicine has been used in female children who have started having menstrual periods. Overdosage: If you think you have taken too much of this medicine contact a poison control center or emergency room at once. NOTE: This medicine is only for  you. Do not share this medicine with others. What if I miss a dose? You will need to replace your vaginal ring once a month as directed. If the ring should slip out, or if you leave it in longer or shorter than you should, contact your health care professional for advice. What may interact with this medicine? Do not take this medicine with the following medication: -dasabuvir; ombitasvir; paritaprevir; ritonavir -ombitasvir; paritaprevir; ritonavir This medicine may also interact with the following medications: -acetaminophen -antibiotics or medicines for infections, especially rifampin, rifabutin, rifapentine, and griseofulvin, and possibly penicillins or tetracyclines -aprepitant -ascorbic acid (vitamin C) -atorvastatin -barbiturate medicines, such as phenobarbital -bosentan -carbamazepine -caffeine -clofibrate -cyclosporine -dantrolene -doxercalciferol -felbamate -grapefruit juice -hydrocortisone -medicines for anxiety or sleeping problems, such as diazepam or temazepam -medicines for diabetes, including pioglitazone -modafinil -mycophenolate -nefazodone -oxcarbazepine -phenytoin -prednisolone -ritonavir or other medicines for HIV infection or AIDS -rosuvastatin -selegiline -soy isoflavones supplements -St. John's wort -tamoxifen or raloxifene -theophylline -thyroid hormones -topiramate -warfarin This list may not describe all possible interactions. Give your health care provider a list   of all the medicines, herbs, non-prescription drugs, or dietary supplements you use. Also tell them if you smoke, drink alcohol, or use illegal drugs. Some items may interact with your medicine. What should I watch for while using this medicine? Visit your doctor or health care professional for regular checks on your progress. You will need a regular breast and pelvic exam and Pap smear while on this medicine. Use an additional method of contraception during the first cycle that you use  this ring. Do not use a diaphragm or female condom, as the ring can interfere with these birth control methods and their proper placement. If you have any reason to think you are pregnant, stop using this medicine right away and contact your doctor or health care professional. If you are using this medicine for hormone related problems, it may take several cycles of use to see improvement in your condition. Smoking increases the risk of getting a blood clot or having a stroke while you are using hormonal birth control, especially if you are more than 21 years old. You are strongly advised not to smoke. This medicine can make your body retain fluid, making your fingers, hands, or ankles swell. Your blood pressure can go up. Contact your doctor or health care professional if you feel you are retaining fluid. This medicine can make you more sensitive to the sun. Keep out of the sun. If you cannot avoid being in the sun, wear protective clothing and use sunscreen. Do not use sun lamps or tanning beds/booths. If you wear contact lenses and notice visual changes, or if the lenses begin to feel uncomfortable, consult your eye care specialist. In some women, tenderness, swelling, or minor bleeding of the gums may occur. Notify your dentist if this happens. Brushing and flossing your teeth regularly may help limit this. See your dentist regularly and inform your dentist of the medicines you are taking. If you are going to have elective surgery, you may need to stop using this medicine before the surgery. Consult your health care professional for advice. This medicine does not protect you against HIV infection (AIDS) or any other sexually transmitted diseases. What side effects may I notice from receiving this medicine? Side effects that you should report to your doctor or health care professional as soon as possible: -breast tissue changes or discharge -changes in vaginal bleeding during your period or between  your periods -chest pain -coughing up blood -dizziness or fainting spells -headaches or migraines -leg, arm or groin pain -severe or sudden headaches -stomach pain (severe) -sudden shortness of breath -sudden loss of coordination, especially on one side of the body -speech problems -symptoms of vaginal infection like itching, irritation or unusual discharge -tenderness in the upper abdomen -vomiting -weakness or numbness in the arms or legs, especially on one side of the body -yellowing of the eyes or skin Side effects that usually do not require medical attention (report to your doctor or health care professional if they continue or are bothersome): -breakthrough bleeding and spotting that continues beyond the 3 initial cycles of pills -breast tenderness -mood changes, anxiety, depression, frustration, anger, or emotional outbursts -increased sensitivity to sun or ultraviolet light -nausea -skin rash, acne, or brown spots on the skin -weight gain (slight) This list may not describe all possible side effects. Call your doctor for medical advice about side effects. You may report side effects to FDA at 1-800-FDA-1088. Where should I keep my medicine? Keep out of the reach of children. Store at   room temperature between 15 and 30 degrees C (59 and 86 degrees F) for up to 4 months. The product will expire after 4 months. Protect from light. Throw away any unused medicine after the expiration date. NOTE: This sheet is a summary. It may not cover all possible information. If you have questions about this medicine, talk to your doctor, pharmacist, or health care provider.  2018 Elsevier/Gold Standard (2016-06-17 17:00:31)  

## 2018-06-11 NOTE — Progress Notes (Signed)
22 y.o. 680P0000 Single Caucasian female here for annual exam.    Happy with her OCPs overall.  Takes Ibuprofen for cramping. Missed one pill per month.   Thyroid last year showing thyroiditis.  TFTs normal in October 2019.   Feels cold.   Primary school [employer]ic [employer] at [employer].  PCP: No PCP    Patient's last menstrual period was 06/10/2018.           Sexually active: Yes.    The current method of family planning is LOESTRIN.  Condoms.  Exercising: Yes.    running, body weights, and light weight training Smoker:  no  Health Maintenance: Pap:  n/a History of abnormal Pap:  n/a TDaP:  04/2015 Gardasil:   Completed 2015 HIV and Hep C: 05/05/17 Negative Screening Labs: discuss today   reports that she has never smoked. She has never used smokeless tobacco. She reports that she does not drink alcohol or use drugs.  History reviewed. No pertinent past medical history.  Past Surgical History:  Procedure Laterality Date  . WISDOM TOOTH EXTRACTION  2016    Current Outpatient Medications  Medication Sig Dispense Refill  . Ibuprofen (ADVIL) 200 MG CAPS Take by mouth daily as needed.    . Norethindrone Acetate-Ethinyl Estradiol (LOESTRIN 1.5/30, 21,) 1.5-30 MG-MCG tablet Take 1 tablet by mouth daily. 3 Package 3   No current facility-administered medications for this visit.     Family History  Problem Relation Age of Onset  . Diabetes Mother   . Heart disease Maternal Grandfather   . Heart disease Paternal Grandfather   . Diabetes Brother     Review of Systems  All other systems reviewed and are negative.   Exam:   BP 98/62 (BP Location: Right Arm, Patient Position: Sitting, Cuff Size: Normal)   Pulse 64   Resp 14   Ht 5\' 4"  (1.626 m)   Wt 138 lb (62.6 kg)   LMP 06/10/2018   BMI 23.69 kg/m     General appearance: alert, cooperative and appears stated age Head: Normocephalic, without obvious abnormality, atraumatic Neck: no adenopathy, supple, symmetrical, trachea midline  and thyroid normal to inspection and palpation Lungs: clear to auscultation bilaterally Breasts: normal appearance, no masses or tenderness, No nipple retraction or dimpling, No nipple discharge or bleeding, No axillary or supraclavicular adenopathy Heart: regular rate and rhythm Abdomen: soft, non-tender; no masses, no organomegaly Extremities: extremities normal, atraumatic, no cyanosis or edema Skin: Skin color, texture, turgor normal. No rashes or lesions Lymph nodes: Cervical, supraclavicular, and axillary nodes normal. No abnormal inguinal nodes palpated Neurologic: Grossly normal  Pelvic: External genitalia:  no lesions              Urethra:  normal appearing urethra with no masses, tenderness or lesions              Bartholins and Skenes: normal                 Vagina: normal appearing vagina with normal color and discharge, no lesions              Cervix: no lesions              Pap taken: No. Bimanual Exam:  Uterus:  normal size, contour, position, consistency, mobility, non-tender              Adnexa: no mass, fullness, tenderness      Chaperone was present for exam.  Assessment:   Well woman visit with normal  exam. Hx thyroid enlargement.  US shoed thyroiditis.   Plan: Mammogram screening age 22. Recommended self breast awareness. Pap and HR HPV as above. Guidelines for Calcium, Vitamin D, regular exercise program including cardiovascular and weight bearing exercise. STD screening.  Routine labs and TSH.  Refill OCPs for one year.   I also discussed alternatives to OCPs - Ring, patch, IUDs, Depo Provera. Follow up annually and prn.   After visit summary provided.

## 2018-06-12 LAB — CBC
Hematocrit: 40.1 % (ref 34.0–46.6)
Hemoglobin: 13.1 g/dL (ref 11.1–15.9)
MCH: 28.9 pg (ref 26.6–33.0)
MCHC: 32.7 g/dL (ref 31.5–35.7)
MCV: 89 fL (ref 79–97)
Platelets: 263 10*3/uL (ref 150–450)
RBC: 4.53 x10E6/uL (ref 3.77–5.28)
RDW: 11.7 % — AB (ref 12.3–15.4)
WBC: 5.1 10*3/uL (ref 3.4–10.8)

## 2018-06-12 LAB — LIPID PANEL
CHOL/HDL RATIO: 2.7 ratio (ref 0.0–4.4)
Cholesterol, Total: 133 mg/dL (ref 100–199)
HDL: 50 mg/dL (ref >39)
LDL CALC: 69 mg/dL (ref 0–99)
Triglycerides: 69 mg/dL (ref 0–149)
VLDL Cholesterol Cal: 14 mg/dL (ref 5–40)

## 2018-06-12 LAB — COMPREHENSIVE METABOLIC PANEL
A/G RATIO: 1.8 (ref 1.2–2.2)
ALT: 35 IU/L — AB (ref 0–32)
AST: 71 IU/L — AB (ref 0–40)
Albumin: 4.6 g/dL (ref 3.5–5.5)
Alkaline Phosphatase: 47 IU/L (ref 39–117)
BUN/Creatinine Ratio: 18 (ref 9–23)
BUN: 12 mg/dL (ref 6–20)
Bilirubin Total: 0.5 mg/dL (ref 0.0–1.2)
CALCIUM: 9.2 mg/dL (ref 8.7–10.2)
CHLORIDE: 100 mmol/L (ref 96–106)
CO2: 23 mmol/L (ref 20–29)
Creatinine, Ser: 0.67 mg/dL (ref 0.57–1.00)
GFR calc Af Amer: 146 mL/min/{1.73_m2} (ref >59)
GFR, EST NON AFRICAN AMERICAN: 127 mL/min/{1.73_m2} (ref >59)
Globulin, Total: 2.5 g/dL (ref 1.5–4.5)
Glucose: 68 mg/dL (ref 65–99)
POTASSIUM: 4.4 mmol/L (ref 3.5–5.2)
Sodium: 138 mmol/L (ref 134–144)
Total Protein: 7.1 g/dL (ref 6.0–8.5)

## 2018-06-12 LAB — HEP, RPR, HIV PANEL
HEP B S AG: NEGATIVE
HIV Screen 4th Generation wRfx: NONREACTIVE
RPR Ser Ql: NONREACTIVE

## 2018-06-12 LAB — CERVICOVAGINAL ANCILLARY ONLY
CHLAMYDIA, DNA PROBE: NEGATIVE
Neisseria Gonorrhea: NEGATIVE
TRICH (WINDOWPATH): NEGATIVE

## 2018-06-12 LAB — TSH: TSH: 2.62 u[IU]/mL (ref 0.450–4.500)

## 2018-06-12 LAB — HEPATITIS C ANTIBODY: Hep C Virus Ab: <0.1 s/co ratio (ref 0.0–0.9)

## 2018-06-13 ENCOUNTER — Encounter: Payer: Self-pay | Admitting: Obstetrics and Gynecology

## 2018-06-13 ENCOUNTER — Telehealth: Payer: Self-pay | Admitting: *Deleted

## 2018-06-13 NOTE — Telephone Encounter (Signed)
Notes recorded by Leda MinHamm, Kasiyah Platter N, RN on 06/13/2018 at 11:52 AM EDT Left message to call Noreene LarssonJill at [phone].

## 2018-06-13 NOTE — Telephone Encounter (Signed)
-----   Message from Patton SallesBrook E Amundson C Silva, MD sent at 06/13/2018  6:36 AM EDT ----- Please contact patient with results. Testing for infectious disease is all negative for HIV, syphilis, hepatitis B and C, trichomonas, gonorrhea, and chlamydia.   Her cholesterol, thyroid, and blood counts all look good.  Her liver function tests are elevated.  This could be due to taking medications, alcohol, or sometimes infection.  I recommend repeating again in 4 weeks.  Please schedule a lab visit and I will place a future order.

## 2018-06-13 NOTE — Addendum Note (Signed)
Addended by: Ardell IsaacsAMUNDSON C SILVA, Debbe BalesBROOK E on: 06/13/2018 06:36 AM   Modules accepted: Orders

## 2018-06-18 NOTE — Telephone Encounter (Signed)
Left detailed message, ok per dpr. Advised to return call to Noreene LarssonJill, [facility] at [facility] to review recent lab results.

## 2018-06-18 NOTE — Telephone Encounter (Signed)
Spoke with patient, advised of results as seen below per Dr. Edward JollySilva. Lab appt scheduled for 07/20/18 at 2:30pm. Patient verbalizes understanding and is agreeable. Encounter closed.

## 2018-07-20 ENCOUNTER — Other Ambulatory Visit (INDEPENDENT_AMBULATORY_CARE_PROVIDER_SITE_OTHER): Payer: BLUE CROSS/BLUE SHIELD

## 2018-07-20 DIAGNOSIS — R748 Abnormal levels of other serum enzymes: Secondary | ICD-10-CM

## 2018-07-21 LAB — HEPATIC FUNCTION PANEL
ALBUMIN: 4.3 g/dL (ref 3.5–5.5)
ALK PHOS: 51 IU/L (ref 39–117)
ALT: 18 IU/L (ref 0–32)
AST: 23 IU/L (ref 0–40)
Bilirubin Total: 0.3 mg/dL (ref 0.0–1.2)
Bilirubin, Direct: 0.1 mg/dL (ref 0.00–0.40)
Total Protein: 6.7 g/dL (ref 6.0–8.5)

## 2018-11-14 ENCOUNTER — Telehealth: Payer: Self-pay | Admitting: Obstetrics and Gynecology

## 2018-11-14 NOTE — Telephone Encounter (Signed)
Patient is returning a call to Jill. °

## 2018-11-14 NOTE — Telephone Encounter (Signed)
Spoke with patient. Patient states there are no forms to complete. Patient is requesting a letter allowing her to have a emotional support animal in her apartment. Patient lives in [place]. Patient is going to f/u with [facility] to request letter. Will return call if any additional assistance is needed.   Notes from [facility] to scan.   Routing to provider for final review. Patient is agreeable to disposition. Will close encounter.

## 2018-11-14 NOTE — Telephone Encounter (Signed)
Patient calling to check on paperwork for emotional support animal.

## 2018-11-14 NOTE — Telephone Encounter (Addendum)
Left message to call Noreene LarssonJill, RN at [facility] [phone].    Notes received from [facility], reviewed by Dr. Edward JollySilva. No forms received to complete.  If patient is requesting recommendation for emotional support animal, patient to [facility] or  referral to local counselor.

## 2019-04-10 ENCOUNTER — Other Ambulatory Visit: Payer: Self-pay | Admitting: Obstetrics and Gynecology

## 2019-04-10 MED ORDER — NORETHINDRONE ACET-ETHINYL EST 1.5-30 MG-MCG PO TABS: 1.0000 | ORAL_TABLET | Freq: Every day | ORAL | 0 refills | Status: AC

## 2019-04-10 NOTE — Telephone Encounter (Signed)
Patient requesting a refill on birth control. [facility] in [place].

## 2019-04-10 NOTE — Telephone Encounter (Signed)
Medication refill request: Loestrin  Last AEX:  06/11/18 Next AEX: 06/26/19 Last MMG (if hormonal medication request): NA Refill authorized: #3 packs with 0 RF

## 2019-05-03 IMAGING — US US SOFT TISSUE HEAD/NECK
1 series · 14 of 25 positions shown · non-contrast
Comparison: None.

CLINICAL DATA: Goiter.  Thyroid enlargement.

EXAM:
THYROID ULTRASOUND
TECHNIQUE: Ultrasound examination of the thyroid gland and adjacent soft
tissues was performed.

[Series 1: us soft tissue head/neck · 0.04mm/px · 14 of 36 slices shown]
[im 1/36]
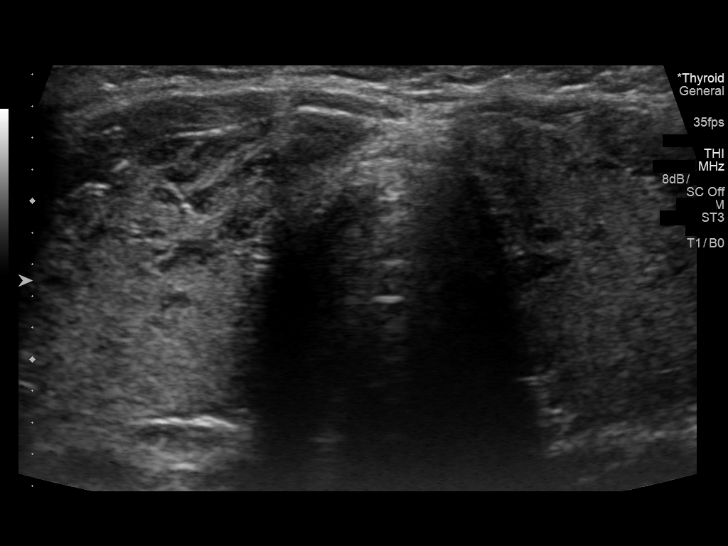
[im 3/36]
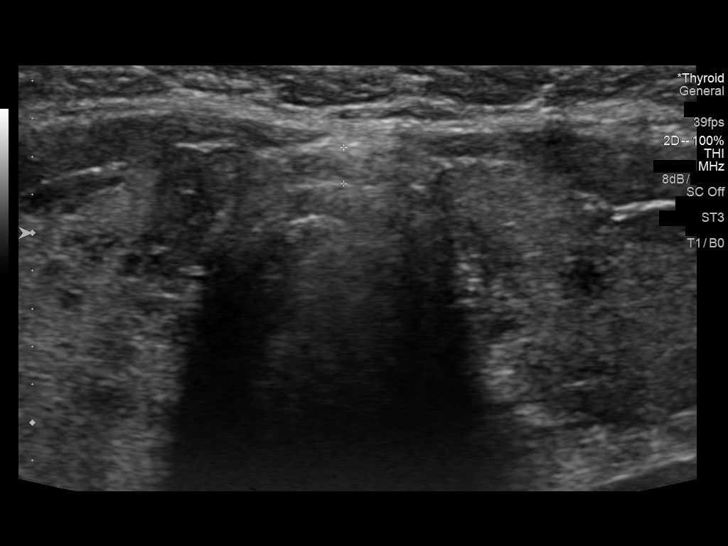
[im 6/36]
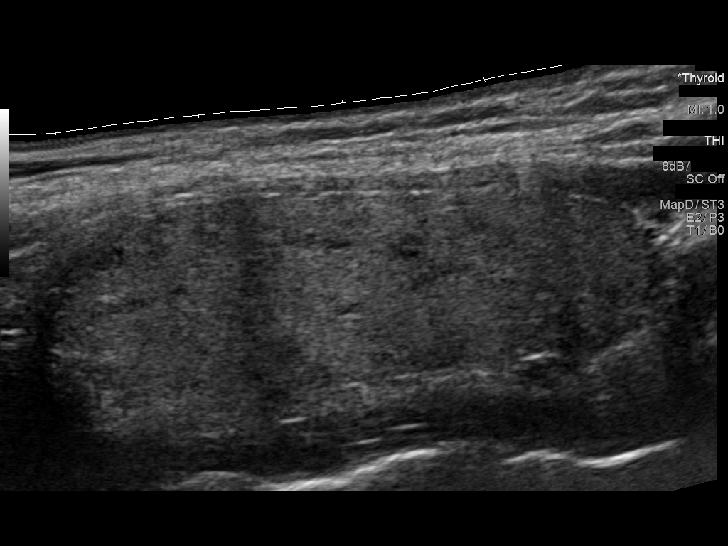
[im 9/36]
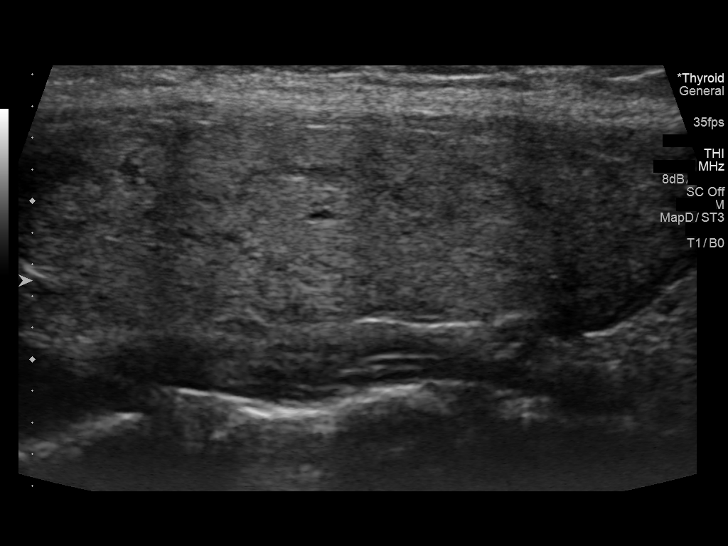
[im 12/36]
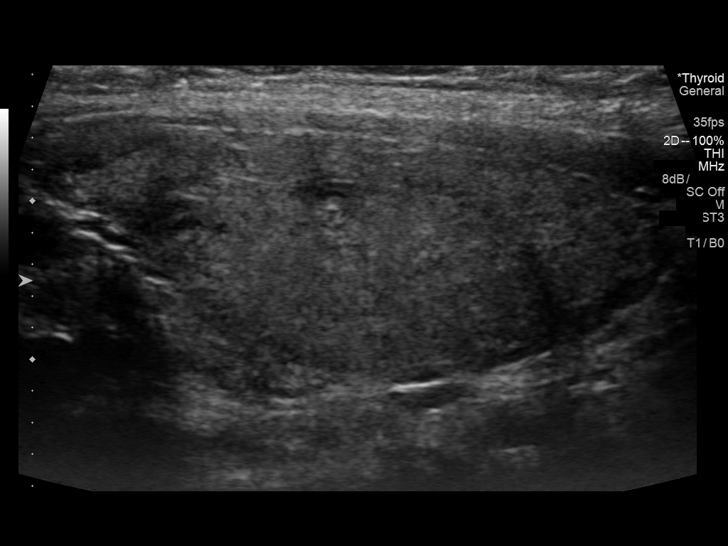
[im 14/36]
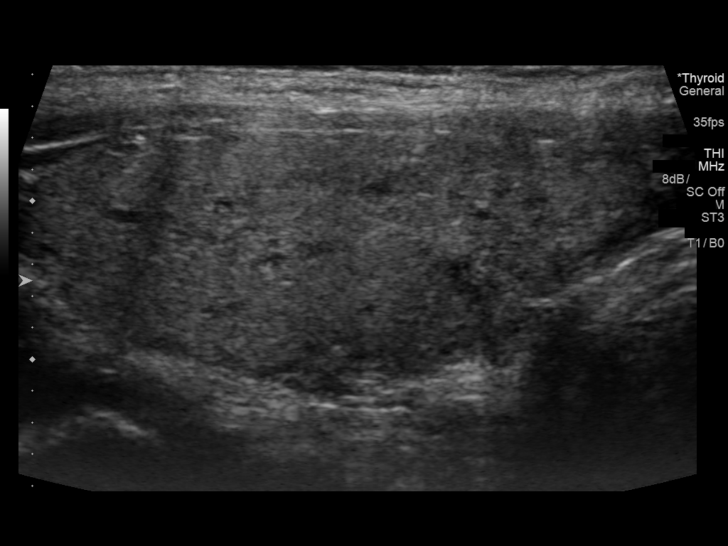
[im 17/36]
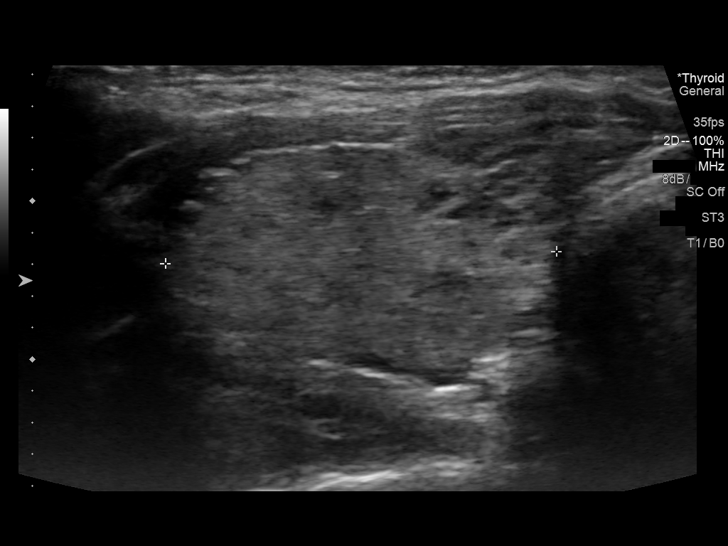
[im 19/36]
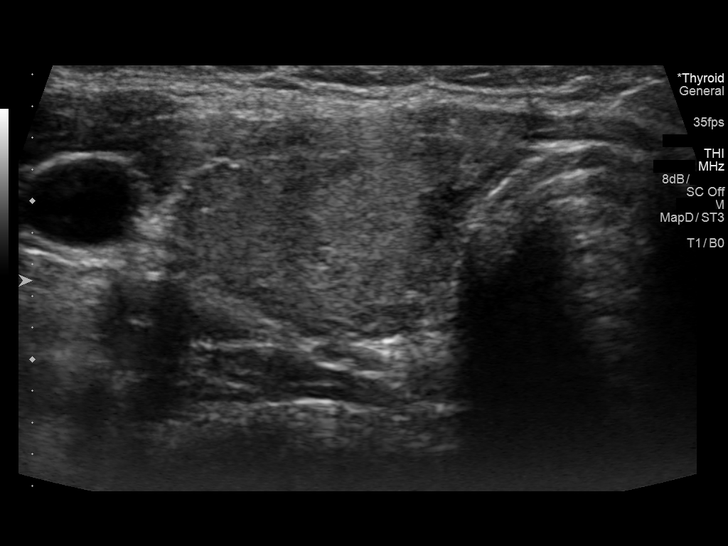
[im 22/36]
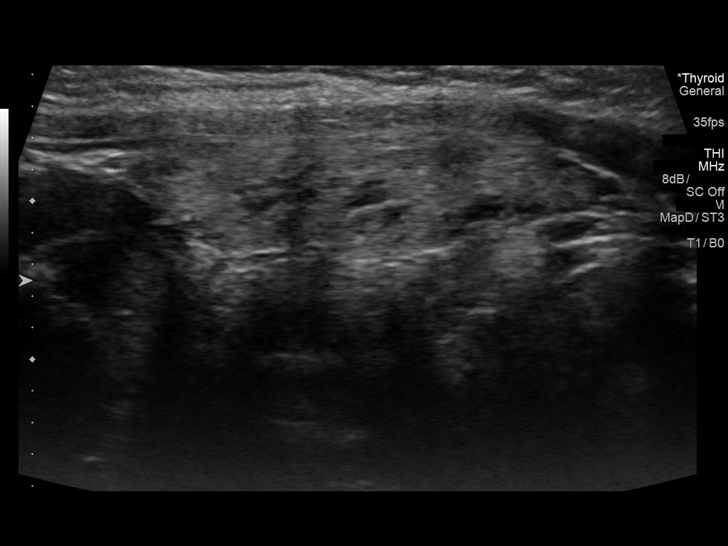
[im 24/36]
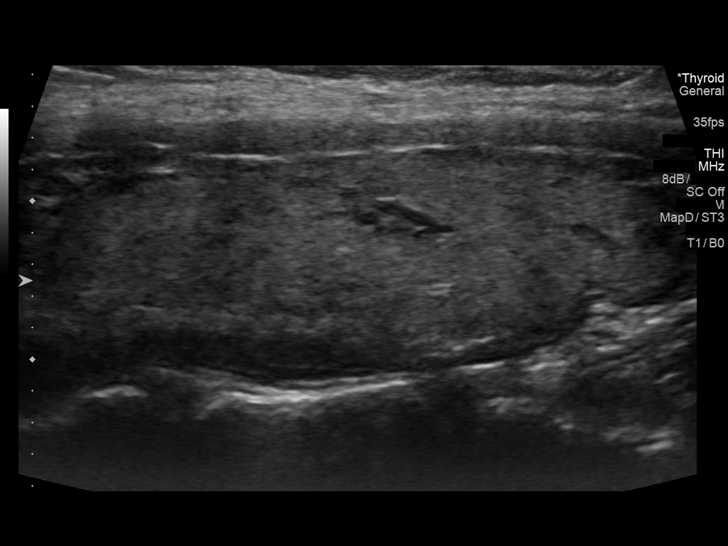
[im 27/36]
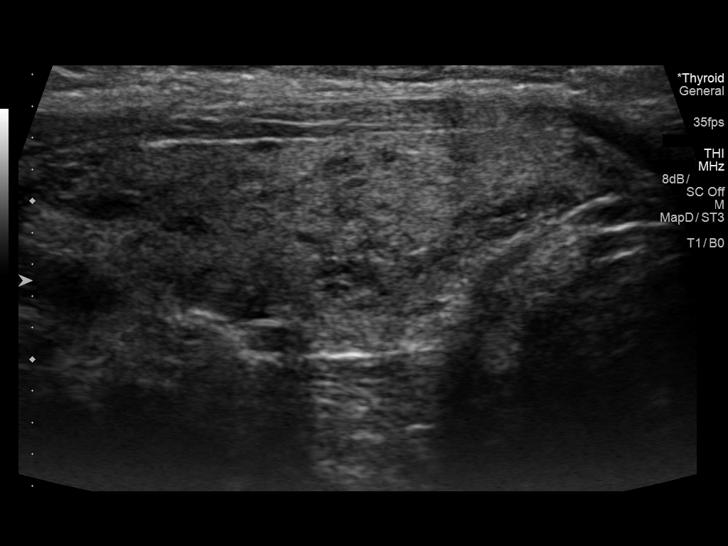
[im 30/36]
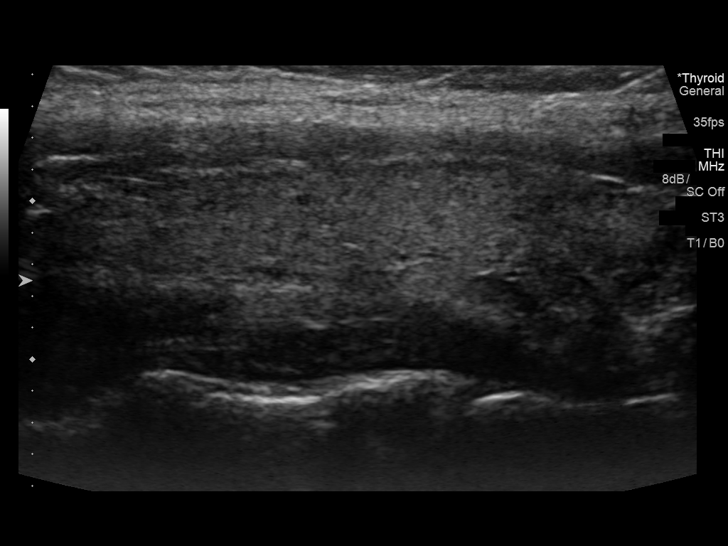
[im 33/36]
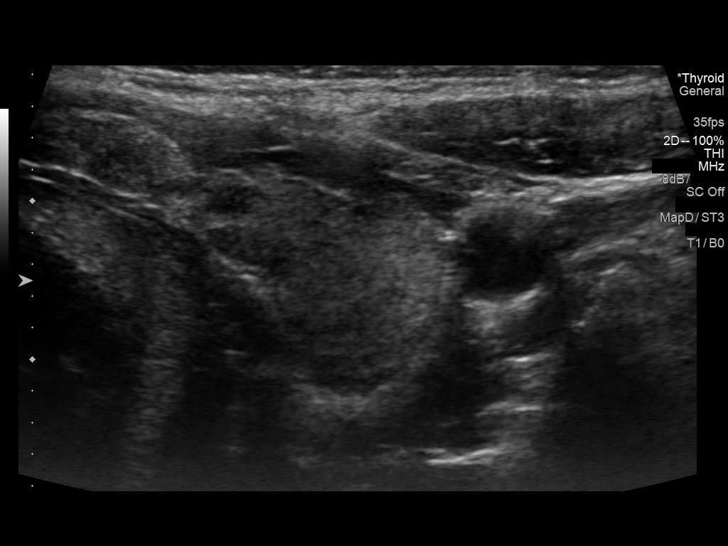
[im 36/36]
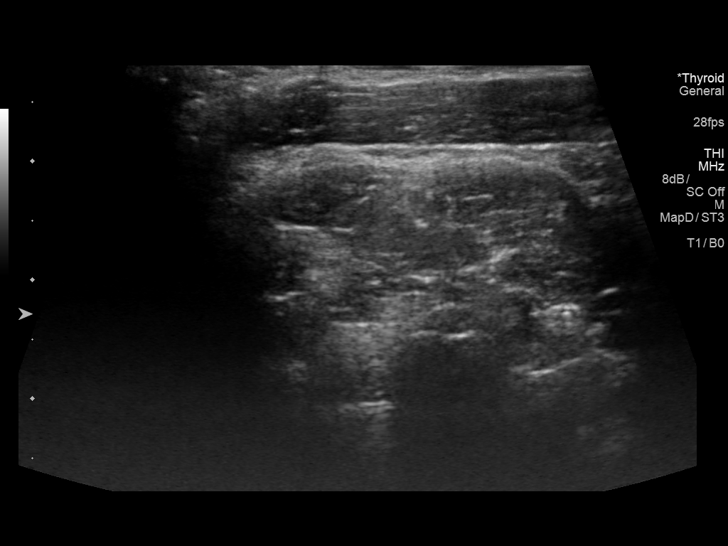

[14 of 25 positions shown; findings below may reference images not displayed]

FINDINGS: Parenchymal Echotexture: Moderately heterogenous

Isthmus: 0.2 cm

Right lobe: 4.2 x 1.5 x 2.5 cm

Left lobe: 4.2 x 1.6 x 1.8 cm

_________________________________________________________

Estimated total number of nodules >/= 1 cm: 0

Number of spongiform nodules >/=  2 cm not described below (TR1): 0

Number of mixed cystic and solid nodules >/= 1.5 cm not described
below (TR2): 0

_________________________________________________________

Mildly prominent thyroid gland for age. Heterogeneity suggests
underlying thyroiditis. No discrete nodules are seen within the
thyroid gland. No abnormal lymph nodes identified in the neck.
IMPRESSION: Mildly prominent thyroid gland with moderately heterogeneous
parenchyma. Appearance of parenchyma is suggestive of underlying
thyroiditis. No evidence of focal thyroid nodules.

The above is in keeping with the ACR TI-RADS recommendations - [HOSPITAL] 1735;[DATE].

## 2019-05-22 DIAGNOSIS — Z113 Encounter for screening for infections with a predominantly sexual mode of transmission: Secondary | ICD-10-CM | POA: Diagnosis not present

## 2019-05-22 DIAGNOSIS — Z01419 Encounter for gynecological examination (general) (routine) without abnormal findings: Secondary | ICD-10-CM | POA: Diagnosis not present

## 2019-05-22 DIAGNOSIS — Z1159 Encounter for screening for other viral diseases: Secondary | ICD-10-CM | POA: Diagnosis not present

## 2019-06-26 ENCOUNTER — Ambulatory Visit: Payer: BLUE CROSS/BLUE SHIELD | Admitting: Obstetrics and Gynecology
